# Patient Record
Sex: Male | Born: 1950 | Race: White | Hispanic: No | Marital: Married | Smoking: Never smoker
Health system: Southern US, Community
[De-identification: ages and names within clinical notes are randomized; demographics above are authoritative.]

## PROBLEM LIST (undated history)

## (undated) DIAGNOSIS — I2510 Atherosclerotic heart disease of native coronary artery without angina pectoris: Secondary | ICD-10-CM

## (undated) DIAGNOSIS — I480 Paroxysmal atrial fibrillation: Secondary | ICD-10-CM

## (undated) DIAGNOSIS — R001 Bradycardia, unspecified: Secondary | ICD-10-CM

## (undated) DIAGNOSIS — Z720 Tobacco use: Secondary | ICD-10-CM

## (undated) DIAGNOSIS — M797 Fibromyalgia: Secondary | ICD-10-CM

## (undated) DIAGNOSIS — E119 Type 2 diabetes mellitus without complications: Secondary | ICD-10-CM

## (undated) DIAGNOSIS — I10 Essential (primary) hypertension: Secondary | ICD-10-CM

## (undated) DIAGNOSIS — E785 Hyperlipidemia, unspecified: Secondary | ICD-10-CM

## (undated) HISTORY — PX: CARDIAC CATHETERIZATION: SHX172

## (undated) HISTORY — DX: Tobacco use: Z72.0

## (undated) HISTORY — DX: Essential (primary) hypertension: I10

## (undated) HISTORY — DX: Hyperlipidemia, unspecified: E78.5

## (undated) HISTORY — DX: Bradycardia, unspecified: R00.1

## (undated) HISTORY — DX: Atherosclerotic heart disease of native coronary artery without angina pectoris: I25.10

## (undated) HISTORY — DX: Paroxysmal atrial fibrillation: I48.0

## (undated) HISTORY — PX: SHOULDER SURGERY: SHX246

---

## 1997-10-19 ENCOUNTER — Encounter: Admission: RE | Admit: 1997-10-19 | Discharge: 1998-01-17 | Payer: Self-pay

## 2001-06-24 ENCOUNTER — Encounter: Payer: Self-pay | Admitting: Occupational Medicine

## 2001-06-24 ENCOUNTER — Encounter: Admission: RE | Admit: 2001-06-24 | Discharge: 2001-06-24 | Payer: Self-pay | Admitting: Occupational Medicine

## 2002-06-30 ENCOUNTER — Encounter: Payer: Self-pay | Admitting: Occupational Medicine

## 2002-06-30 ENCOUNTER — Encounter: Admission: RE | Admit: 2002-06-30 | Discharge: 2002-06-30 | Payer: Self-pay | Admitting: Occupational Medicine

## 2004-01-13 ENCOUNTER — Ambulatory Visit: Payer: Self-pay | Admitting: Internal Medicine

## 2004-02-06 ENCOUNTER — Ambulatory Visit: Payer: Self-pay | Admitting: Internal Medicine

## 2004-05-23 ENCOUNTER — Ambulatory Visit: Payer: Self-pay | Admitting: Internal Medicine

## 2005-02-08 ENCOUNTER — Ambulatory Visit: Payer: Self-pay | Admitting: Cardiology

## 2005-05-07 ENCOUNTER — Ambulatory Visit: Payer: Self-pay | Admitting: Gastroenterology

## 2006-01-06 ENCOUNTER — Ambulatory Visit: Payer: Self-pay | Admitting: Internal Medicine

## 2006-01-06 ENCOUNTER — Inpatient Hospital Stay (HOSPITAL_COMMUNITY): Admission: EM | Admit: 2006-01-06 | Discharge: 2006-01-08 | Payer: Self-pay | Admitting: Emergency Medicine

## 2006-01-17 ENCOUNTER — Ambulatory Visit: Payer: Self-pay | Admitting: Internal Medicine

## 2006-01-22 ENCOUNTER — Ambulatory Visit: Payer: Self-pay | Admitting: Internal Medicine

## 2006-02-03 ENCOUNTER — Ambulatory Visit: Payer: Self-pay | Admitting: Internal Medicine

## 2006-09-02 ENCOUNTER — Ambulatory Visit: Payer: Self-pay | Admitting: Internal Medicine

## 2006-09-09 ENCOUNTER — Ambulatory Visit: Payer: Self-pay | Admitting: Internal Medicine

## 2007-02-16 ENCOUNTER — Ambulatory Visit: Payer: Self-pay | Admitting: Internal Medicine

## 2007-11-20 ENCOUNTER — Ambulatory Visit (HOSPITAL_COMMUNITY): Admission: RE | Admit: 2007-11-20 | Discharge: 2007-11-20 | Payer: Self-pay | Admitting: Internal Medicine

## 2007-12-29 ENCOUNTER — Encounter: Admission: RE | Admit: 2007-12-29 | Discharge: 2007-12-29 | Payer: Self-pay | Admitting: Neurosurgery

## 2008-01-22 ENCOUNTER — Ambulatory Visit (HOSPITAL_COMMUNITY): Admission: RE | Admit: 2008-01-22 | Discharge: 2008-01-22 | Payer: Self-pay | Admitting: Neurosurgery

## 2008-01-25 ENCOUNTER — Ambulatory Visit (HOSPITAL_COMMUNITY): Admission: RE | Admit: 2008-01-25 | Discharge: 2008-01-25 | Payer: Self-pay | Admitting: Neurosurgery

## 2008-02-18 ENCOUNTER — Ambulatory Visit (HOSPITAL_COMMUNITY): Admission: RE | Admit: 2008-02-18 | Discharge: 2008-02-19 | Payer: Self-pay | Admitting: Neurosurgery

## 2008-11-11 ENCOUNTER — Ambulatory Visit: Payer: Self-pay | Admitting: Unknown Physician Specialty

## 2008-11-18 ENCOUNTER — Ambulatory Visit: Payer: Self-pay | Admitting: Unknown Physician Specialty

## 2008-11-25 ENCOUNTER — Ambulatory Visit: Payer: Self-pay | Admitting: Unknown Physician Specialty

## 2008-12-21 ENCOUNTER — Ambulatory Visit: Payer: Self-pay | Admitting: Gastroenterology

## 2009-03-05 ENCOUNTER — Emergency Department: Payer: Self-pay | Admitting: Unknown Physician Specialty

## 2010-02-19 ENCOUNTER — Ambulatory Visit: Payer: Self-pay | Admitting: Internal Medicine

## 2010-03-07 ENCOUNTER — Ambulatory Visit: Payer: Self-pay | Admitting: Orthopedic Surgery

## 2010-04-03 ENCOUNTER — Encounter: Payer: Self-pay | Admitting: Physician Assistant

## 2010-05-02 ENCOUNTER — Encounter: Payer: Self-pay | Admitting: Physician Assistant

## 2010-07-07 IMAGING — CR DG MYELOGRAM CERVICAL
2 series · 2 of 2 positions shown · IV contrast (omnipaque)
Comparison: MRI 12/29/2007
COMPARISON: MRI 12/29/2007

CLINICAL DATA: Neck pain with left arm pain.

]
MYELOGRAM CERVICAL
TECHNIQUE: Lumbar puncture and injection of Omnipaque contrast was
performed by Dr. Enjyogi. Following injection of intrathecal
Omnipaque contrast, spine imaging in multiple projections was
performed using fluoroscopy.
CLINICAL DATA: Neck pain and left arm pain.
CT MYELOGRAPHY CERVICAL SPINE
TECHNIQUE: CT imaging of the cervical spine was performed after
intrathecal contrast administration. Multiplanar CT image
reconstructions were also generated.

[view not recorded (1 of 2)]
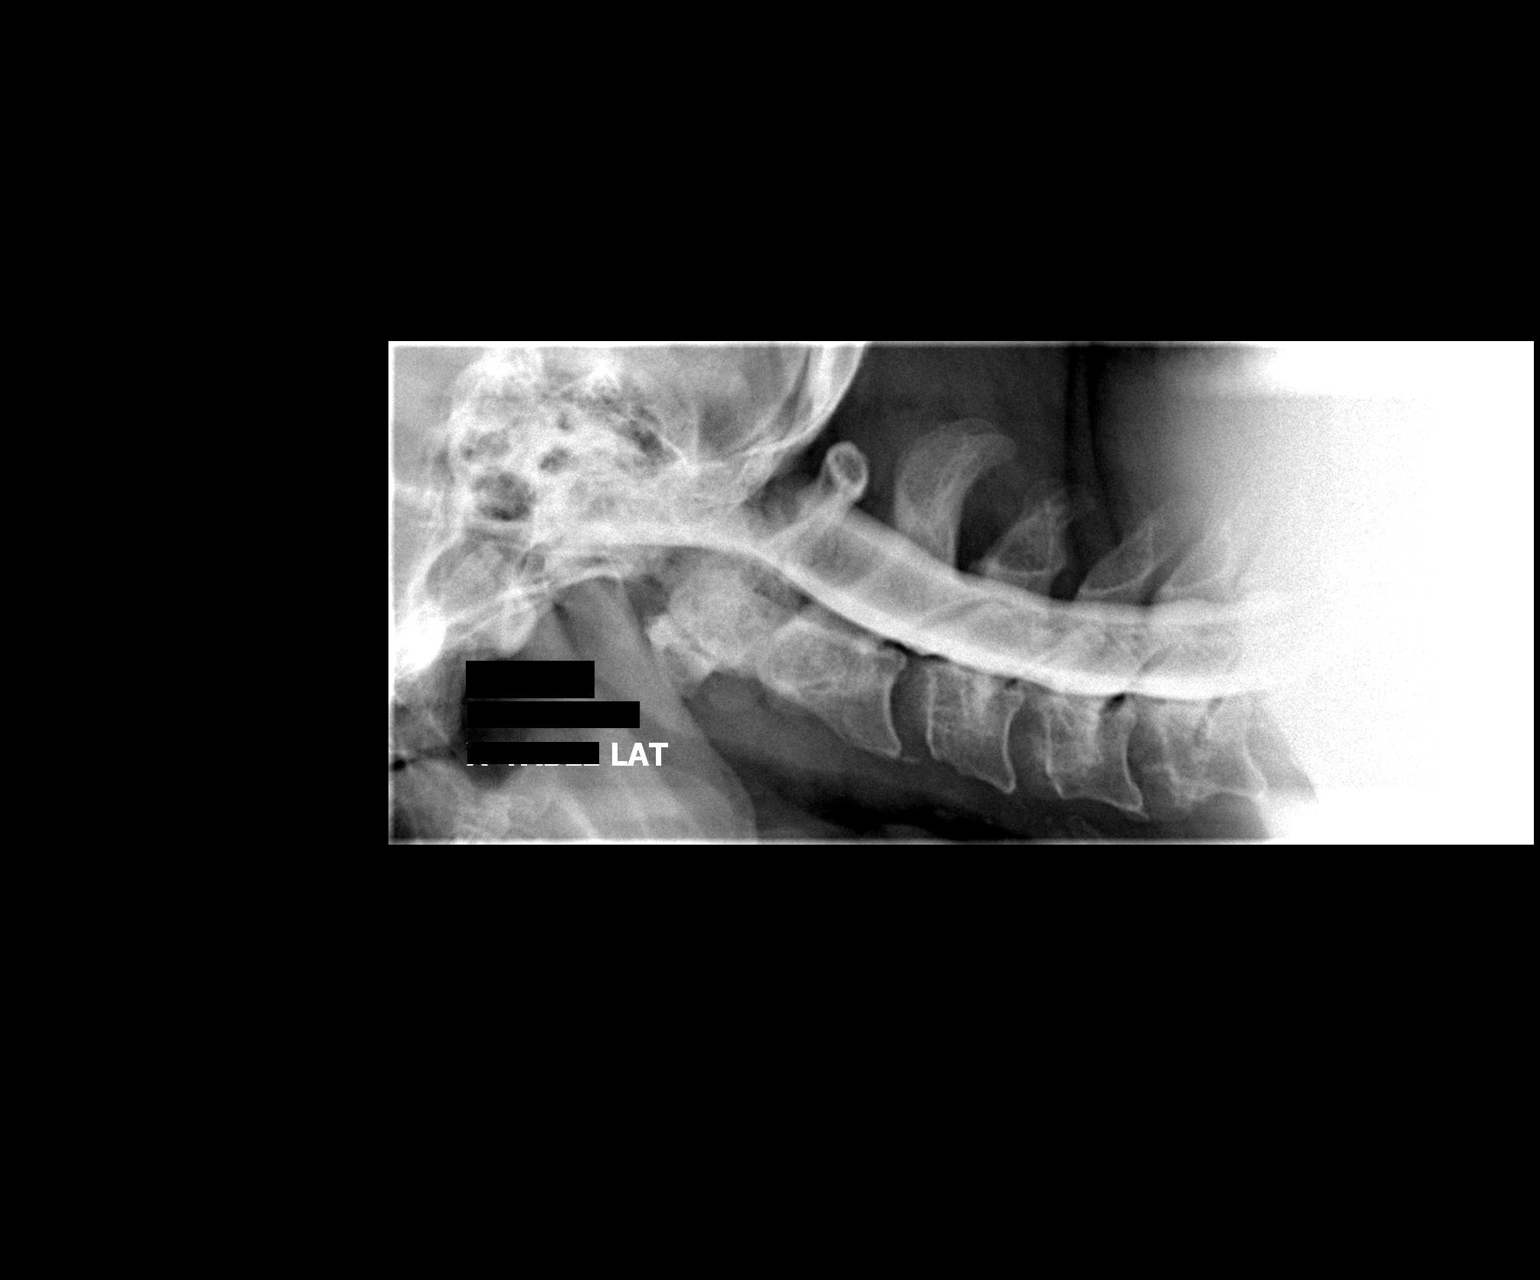

[view not recorded (2 of 2)]
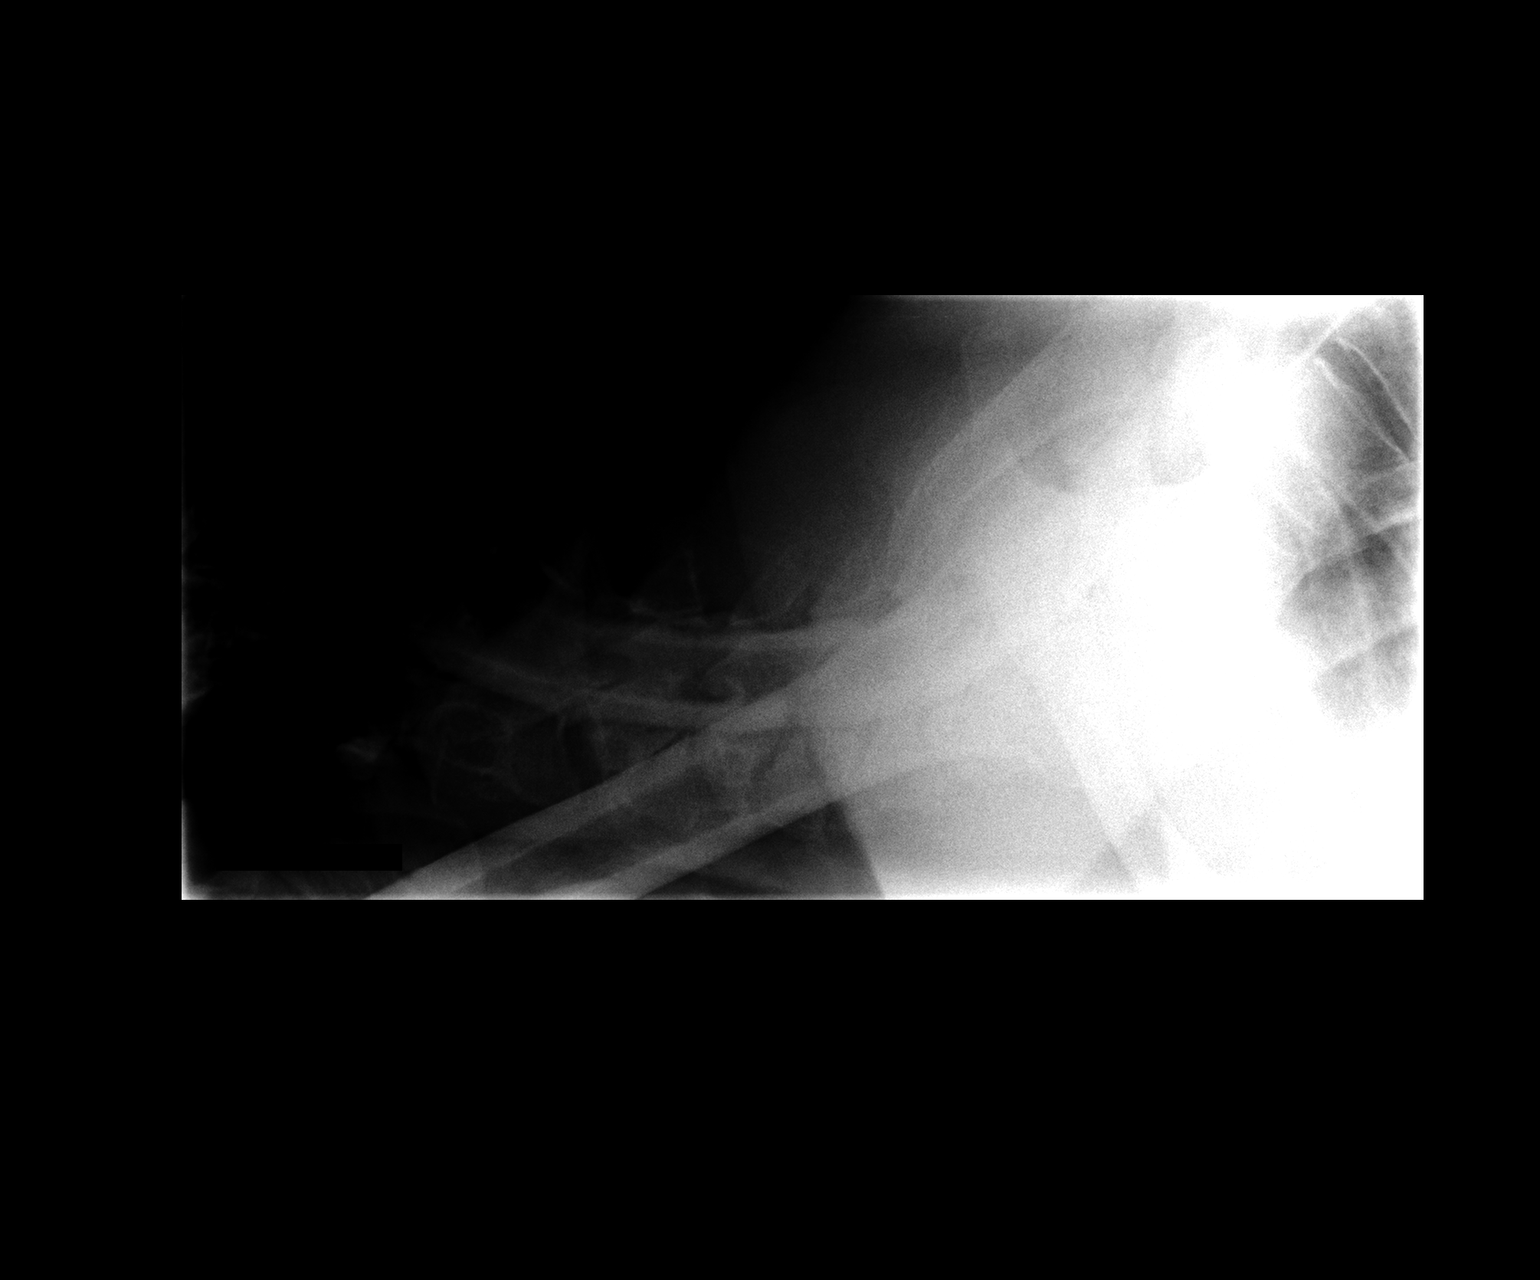

[2 of 2 positions shown; findings below may reference images not displayed]

FINDINGS: There is a prominent extradural defect on the left   at
C3-4.  The remaining nerve roots appear normal.  There is no
significant spinal stenosis.  The cord is normal in size.
IMPRESSION: Extradural defect on the left at C3-4.
FINDINGS: Normal cervical alignment.  There is no acute bony
abnormality.  The cord is normal in size and configuration.  There
was signal abnormality in  the cord at C3-4 on the MRI but this is
not visualized on CT.

C2-3:  Negative

C3-4:  Mild facet arthropathy bilaterally.  There is mild disc
degeneration.  There is very mild left foraminal narrowing due to
bony overgrowth.

C4-5:  There is advanced facet degeneration on the left with bony
overgrowth and cystic changes in the facet joint.  There is facet
joint effusion.  There is moderately severe foraminal encroachment
on the left due to bony overgrowth of the facet as well as mild
diffuse uncinate spurring.  There is mild facet degeneration on the
right with mild foraminal narrowing on the right.  The central
canal is sufficiently patent.

C5-6:  Mild facet degeneration bilaterally.  There is no
significant uncinate spurring or spinal stenosis.

C6-7:  Mild disc degeneration and without spinal stenosis

C7-T1:  Negative
IMPRESSION: There is moderately severe left foraminal encroachment at C4-5 due
to extensive facet overgrowth on the left.  There is also mild
uncinate spurring bilaterally.  There is mild right foraminal
encroachment.

Mild degenerative changes at other levels.  There is no significant
central canal stenosis.

## 2010-07-15 ENCOUNTER — Emergency Department (HOSPITAL_COMMUNITY)
Admission: EM | Admit: 2010-07-15 | Discharge: 2010-07-15 | Disposition: A | Discharge status: Home / Self Care | Payer: BC Managed Care – PPO | Attending: Emergency Medicine | Admitting: Emergency Medicine

## 2010-07-15 ENCOUNTER — Inpatient Hospital Stay (HOSPITAL_COMMUNITY)
Admission: AD | Admit: 2010-07-15 | Discharge: 2010-07-18 | DRG: 125 | Disposition: A | Discharge status: Home / Self Care | Payer: BC Managed Care – PPO | Source: Other Acute Inpatient Hospital | Attending: Cardiology | Admitting: Cardiology

## 2010-07-15 ENCOUNTER — Emergency Department (HOSPITAL_COMMUNITY): Payer: BC Managed Care – PPO

## 2010-07-15 DIAGNOSIS — I4891 Unspecified atrial fibrillation: Secondary | ICD-10-CM | POA: Insufficient documentation

## 2010-07-15 DIAGNOSIS — Z7982 Long term (current) use of aspirin: Secondary | ICD-10-CM | POA: Insufficient documentation

## 2010-07-15 DIAGNOSIS — Z79899 Other long term (current) drug therapy: Secondary | ICD-10-CM | POA: Insufficient documentation

## 2010-07-15 DIAGNOSIS — E785 Hyperlipidemia, unspecified: Secondary | ICD-10-CM | POA: Diagnosis present

## 2010-07-15 DIAGNOSIS — R079 Chest pain, unspecified: Secondary | ICD-10-CM | POA: Insufficient documentation

## 2010-07-15 DIAGNOSIS — I2510 Atherosclerotic heart disease of native coronary artery without angina pectoris: Secondary | ICD-10-CM | POA: Diagnosis present

## 2010-07-15 DIAGNOSIS — R0789 Other chest pain: Secondary | ICD-10-CM | POA: Diagnosis present

## 2010-07-15 DIAGNOSIS — I10 Essential (primary) hypertension: Secondary | ICD-10-CM | POA: Insufficient documentation

## 2010-07-15 DIAGNOSIS — E119 Type 2 diabetes mellitus without complications: Secondary | ICD-10-CM | POA: Insufficient documentation

## 2010-07-15 LAB — DIFFERENTIAL
Basophils Absolute: 0.1 10*3/uL (ref 0.0–0.1)
Eosinophils Relative: 3 % (ref 0–5)
Lymphocytes Relative: 27 % (ref 12–46)
Monocytes Absolute: 0.7 10*3/uL (ref 0.1–1.0)
Neutrophils Relative %: 62 % (ref 43–77)

## 2010-07-15 LAB — CBC
Hemoglobin: 14.5 g/dL (ref 13.0–17.0)
MCV: 89.5 fL (ref 78.0–100.0)
Platelets: 235 10*3/uL (ref 150–400)
RBC: 4.65 MIL/uL (ref 4.22–5.81)
WBC: 9.3 10*3/uL (ref 4.0–10.5)

## 2010-07-15 LAB — CARDIAC PANEL(CRET KIN+CKTOT+MB+TROPI)
CK, MB: 1 ng/mL (ref 0.3–4.0)
Total CK: 84 U/L (ref 7–232)
Troponin I: 0.02 ng/mL (ref 0.00–0.06)

## 2010-07-15 LAB — BASIC METABOLIC PANEL
BUN: 13 mg/dL (ref 6–23)
Chloride: 101 mEq/L (ref 96–112)
Creatinine, Ser: 1.04 mg/dL (ref 0.4–1.5)
Glucose, Bld: 146 mg/dL — ABNORMAL HIGH (ref 70–99)
Potassium: 4.2 mEq/L (ref 3.5–5.1)
Sodium: 135 mEq/L (ref 135–145)

## 2010-07-15 LAB — POCT CARDIAC MARKERS
CKMB, poc: <1 ng/mL — ABNORMAL LOW (ref 1.0–8.0)
Troponin i, poc: <0.05 ng/mL (ref 0.00–0.09)

## 2010-07-15 LAB — PROTIME-INR: INR: 1.01 (ref 0.00–1.49)

## 2010-07-16 DIAGNOSIS — I2510 Atherosclerotic heart disease of native coronary artery without angina pectoris: Secondary | ICD-10-CM

## 2010-07-16 DIAGNOSIS — I4891 Unspecified atrial fibrillation: Secondary | ICD-10-CM

## 2010-07-16 LAB — HEPARIN LEVEL (UNFRACTIONATED): Heparin Unfractionated: 0.1 IU/mL — ABNORMAL LOW (ref 0.30–0.70)

## 2010-07-16 LAB — GLUCOSE, CAPILLARY
Glucose-Capillary: 171 mg/dL — ABNORMAL HIGH (ref 70–99)
Glucose-Capillary: 101 mg/dL — ABNORMAL HIGH (ref 70–99)

## 2010-07-16 LAB — LIPID PANEL
HDL: 33 mg/dL — ABNORMAL LOW (ref >39)
LDL Cholesterol: 55 mg/dL (ref 0–99)
VLDL: 64 mg/dL — ABNORMAL HIGH (ref 0–40)

## 2010-07-16 LAB — CBC
HCT: 38.4 % — ABNORMAL LOW (ref 39.0–52.0)
MCH: 31.3 pg (ref 26.0–34.0)
RDW: 13.7 % (ref 11.5–15.5)
WBC: 6.6 10*3/uL (ref 4.0–10.5)

## 2010-07-16 LAB — T4, FREE: Free T4: 1.18 ng/dL (ref 0.80–1.80)

## 2010-07-16 LAB — CARDIAC PANEL(CRET KIN+CKTOT+MB+TROPI): Relative Index: INVALID (ref 0.0–2.5)

## 2010-07-16 LAB — POCT ACTIVATED CLOTTING TIME: Activated Clotting Time: 146 seconds

## 2010-07-17 DIAGNOSIS — R072 Precordial pain: Secondary | ICD-10-CM

## 2010-07-17 LAB — CBC
HCT: 35.3 % — ABNORMAL LOW (ref 39.0–52.0)
Hemoglobin: 12.5 g/dL — ABNORMAL LOW (ref 13.0–17.0)
MCHC: 35.4 g/dL (ref 30.0–36.0)
RBC: 4 MIL/uL — ABNORMAL LOW (ref 4.22–5.81)

## 2010-07-17 LAB — GLUCOSE, CAPILLARY
Glucose-Capillary: 164 mg/dL — ABNORMAL HIGH (ref 70–99)
Glucose-Capillary: 182 mg/dL — ABNORMAL HIGH (ref 70–99)

## 2010-07-18 LAB — CBC
HCT: 36.6 % — ABNORMAL LOW (ref 39.0–52.0)
Hemoglobin: 12.9 g/dL — ABNORMAL LOW (ref 13.0–17.0)
MCV: 86.9 fL (ref 78.0–100.0)
RBC: 4.21 MIL/uL — ABNORMAL LOW (ref 4.22–5.81)
WBC: 6.1 10*3/uL (ref 4.0–10.5)

## 2010-07-18 LAB — GLUCOSE, CAPILLARY: Glucose-Capillary: 160 mg/dL — ABNORMAL HIGH (ref 70–99)

## 2010-07-19 ENCOUNTER — Ambulatory Visit: Payer: Self-pay | Admitting: Orthopedic Surgery

## 2010-07-29 NOTE — Discharge Summary (Signed)
Gregory [name], Gregory [name] NO.:  [id number]  MEDICAL RECORD NO.:  [id number]           PATIENT TYPE:  I  LOCATION:  2005                         FACILITY:  [facility]  PHYSICIAN:  Peter M. Swaziland, M.D.  DATE OF BIRTH:  1950-09-19  DATE OF ADMISSION:  07/15/2010 DATE OF DISCHARGE:  07/18/2010                              DISCHARGE SUMMARY   PRIMARY CARDIOLOGIST:  Formally Dr. Sherryl Manges, going for followup in [place] was to be determined cardiologist/ nurse practitioner.  DISCHARGE DIAGNOSES: 1. Paroxysmal atrial fibrillation.     a.     Thromboembolic risk reduction with Pradaxa and      antiarrhythmic therapy with sotalol, both new medications. 2. Noncardiac chest pain.     a.     Cardiac catheterization, July 16, 2010:  Moderate mid LAD      in-stent restenosis (unchanged).  Mild nonobstructive stenosis of      the left circumflex and right coronary arteries and preserved left      ventricular function.  Initiated on statin therapy at this      admission. 3. Dyslipidemia (triglycerides 322, HDL 33, LDL 55, total     cholesterol/HDL ratio 4.6).     a.     Initiated on statin therapy at this admission (simvastatin      40 mg p.o. nightly). 4. Bradycardia.     a.     Beta-blocker discontinued in the setting of initiation on      sotalol.  SECONDARY DIAGNOSES: 1. Coronary artery disease.     a.     Percutaneous coronary intervention with bare-metal stent to      mid LAD, 2006 [facility]).     b.     Cardiac catheterization October 2007, revealing 50% in-stent      restenosis and one jailed septal perforator with 90% stenosis      (jailed septal similar to description from [facility] cath report).  No      significant disease in right coronary artery or circumflex.      Preserved LV function. 2. Non-insulin-dependent diabetes mellitus. 3. Hypertension. 4. Remote tobacco abuse, quit 25 years ago.  ALLERGIES AND INTOLERANCES:  CODEINE  (nausea/vomiting).  PROCEDURES: 1. EKG, July 15, 2010:  Atrial fibrillation with RVR at 180 bpm     without ischemic changes. 2. EKG, July 15, 2010:  Normal sinus rhythm, 80 bpm, no significant     ST changes. 3. Chest x-ray, July 15, 2010:  No active disease. 4. Cardiac catheterization, July 16, 2010:  Please see discharge     diagnoses #2 subsection A. 5. 2-D echocardiogram, July 17, 2010:  LV cavity size normal, wall     thickness normal, LVEF normal at 55%-60% with normal wall motion,     but grade 2 diastolic dysfunction. 6. EKG, July 18, 2010:  Sinus bradycardia, 50 bpm, no significant ST     changes, borderline for left axis deviation, no significant Q-     waves, PR 176, QRS 92, and QTC of 435.  HISTORY OF PRESENT ILLNESS:  Gregory [name] is a 60 year old gentleman with the above-noted complex  medical history, who presented initially to [facility] emergency department with complaints of chest discomfort. Subsequently, diagnosed with paroxysmal atrial fibrillation with RVR.  The patient reports 2-3 weeks of nearly daily episodes of chest discomfort.  On the date of his presentation, he had an episode lasting approximately 45 minutes that was relieved with nitroglycerin.  He presented to [facility] ED and was started on heparin, eventually being transferred to [facility] for further evaluation.  When seen by the Cardiology fellow, he was asymptomatic.  The patient reported poor exercise tolerance, but not significantly changed except for perhaps more short of breath in usual.  No PND, orthopnea, pre-syncope.  The patient does note that his chest discomfort was associated with shortness of breath and palpitations.  He has chronic shoulder discomfort which is unchanged.  He denies any other changes.  HOSPITAL COURSE:  The patient was admitted, and after initially spontaneously converting in the emergency department to normal sinus rhythm, he had further episodes of  atrial fibrillation and was initiated on sotalol therapy.  He was noted to have bradycardia and therefore his preadmission beta-blockade therapy was discontinued.  Due to his pretest probability and concerning symptoms, he did undergo cardiac catheterization in spite of enzymes being negative on the afternoon of July 16, 2010.  Luckily, there was no significant change from his prior study in 2007.  He was kept overnight and had a 2-D echocardiogram completed on the following day.  Given a CHADS-VASC score of 3 with diabetes, hypertension, and known coronary disease, the patient was initiated on Pradaxa for thromboembolic risk reduction.  He tolerated his sotalol therapy well and was seen on the morning of July 18, 2010 by Dr. Peter Swaziland and deemed stable for discharge.  One caveat would be the patient still needs to ambulate, off his nitro patch without symptoms prior to going home.  However, the patient is currently feeling fine.  He is anticipated to leave this afternoon.  There is no intolerance listed to statin therapy and the patient has dyslipidemia as well as coronary disease and therefore has been initiated on simvastatin 40 mg p.o. nightly, but this is a new medication at this admission per records available to me.  Also, new this admission as above are sotalol and Pradaxa.  The patient lives in [place] and prefers to follow up in [place] and our office will call him with a follow-up appointment in approximately 2 weeks.  At the time of discharge, the patient received his new medication list, prescriptions, follow-up instructions, and post-cath instructions.  All questions and concerns were addressed prior to leaving the hospital.  DISCHARGE LABS:  WBC is 6.1, HGB 12.9, HCT 36.6, PLT count is 192, WBC differential on admission was within normal limits.  Pro-time 13.5, INR 1.01.  Glucose ranged from 146-307 at this admission.  Sodium 135, potassium 4.2, chloride 101,  bicarb 24, BUN 14, creatinine 1.04. Initial set of point-of-care markers were negative and two full sets of enzymes were also negative.  Total cholesterol 152, triglycerides 322, HDL 33, LDL 55, VLDL 64, total cholesterol/HDL ratio 4.6, free T4 1.18, and TSH of 1.406.  FOLLOWUP PLANS AND APPOINTMENT:  Please see hospital course.  DISCHARGE MEDICATIONS: 1. Acetaminophen 325 mg 1-2 tablets p.o. q.4 h p.r.n. 2. Pradaxa 150 mg 1 tablet p.o. q.12 h. 3. Simvastatin 40 mg 1 tablet p.o. nightly. 4. Sotalol 80 mg 1 tablet p.o. b.i.d. 5. Enteric-coated aspirin 81 mg p.o. daily. 6. Citalopram 20 mg 1 tablet p.o.  daily. 7. Glipizide XL 5 mg 1 tablet t.i.d. 8. Lisinopril 10 mg 1 tablet p.o. daily. 9. Metformin 1 gram p.o. b.i.d. (to be resumed on July 19, 2010). 10.Sublingual nitroglycerin 0.4 mg q.5 minutes up to three doses     p.r.n. for chest discomfort.  DURATION OF DISCHARGE ENCOUNTER:  Including physician time was 35 minutes.     Jarrett Ables, PAC   ______________________________ Peter M. Swaziland, M.D.    MS/MEDQ  D:  07/18/2010  T:  07/18/2010  Job:  [id number]  cc:   Bettey Mare. Lyman Bishop, NP Gerrit Friends. Dietrich Pates, MD, [facility] Salvia, MD, [facility]  Electronically Signed by Jarrett Ables PAC on 07/27/2010 02:05:03 PMElectronically Signed by PETER Swaziland M.D. on 07/29/2010 12:39:30 PM

## 2010-08-01 ENCOUNTER — Encounter: Payer: Self-pay | Admitting: Adult Health

## 2010-08-01 ENCOUNTER — Ambulatory Visit (INDEPENDENT_AMBULATORY_CARE_PROVIDER_SITE_OTHER): Payer: BC Managed Care – PPO | Admitting: Adult Health

## 2010-08-01 DIAGNOSIS — Z7901 Long term (current) use of anticoagulants: Secondary | ICD-10-CM

## 2010-08-01 DIAGNOSIS — E7800 Pure hypercholesterolemia, unspecified: Secondary | ICD-10-CM

## 2010-08-01 DIAGNOSIS — I2510 Atherosclerotic heart disease of native coronary artery without angina pectoris: Secondary | ICD-10-CM

## 2010-08-01 DIAGNOSIS — I4891 Unspecified atrial fibrillation: Secondary | ICD-10-CM

## 2010-08-01 DIAGNOSIS — I480 Paroxysmal atrial fibrillation: Secondary | ICD-10-CM | POA: Insufficient documentation

## 2010-08-01 DIAGNOSIS — E785 Hyperlipidemia, unspecified: Secondary | ICD-10-CM

## 2010-08-01 MED ORDER — PANTOPRAZOLE SODIUM 20 MG PO TBEC: 20.0000 mg | DELAYED_RELEASE_TABLET | Freq: Every day | ORAL | Status: AC

## 2010-08-01 NOTE — Assessment & Plan Note (Signed)
Repeat lipids and LFT's in 6 weeks.

## 2010-08-01 NOTE — Progress Notes (Signed)
HPI: Gregory [name] is a 60 y/o CM we are seeing on follow-up after recent admission to [facility] for PAF and chest pain.  He is usually followed by Dr. Swaziland in [place], but wishes to be seen in [place] [facility] as it is closer to his home. He has a history of CAD with PCI using BMS to LAD, dyslipidemia, diabetes and hypertension. On recent admission he had cardiac catheterization completed secondary to complaints of chest pain during Afib with known history of CAD.  Catheterization demonstrated moderate mid LAD stenosis, mild nonobstructive stenosis of left Cx and RCA with preserved LV fx.  He was placed on sotolol and pradaxa on discharge and was released at [facility].  He was also placed on statin therapy with total cholesterol of 152, HDL 33,LDL 55.  He is so far tolerating the medications without complaints.  He occasionally has indigestion, but does not take a PPI on a regular basis.  He has noticed indigestion more intense since starting new medications.   Allergies  Allergen Reactions  . Codeine     Makes patient sick     Current Outpatient Prescriptions  Medication Sig Dispense Refill  . acetaminophen (TYLENOL) 325 MG tablet Take 650 mg by mouth every 6 (six) hours as needed.        Marland Kitchen aspirin 81 MG tablet Take 81 mg by mouth daily.        . dabigatran (PRADAXA) 150 MG CAPS Take 150 mg by mouth every 12 (twelve) hours.        Marland Kitchen glipiZIDE (GLUCOTROL) 5 MG tablet Take 5 mg by mouth 3 (three) times daily.        Marland Kitchen lisinopril (PRINIVIL,ZESTRIL) 10 MG tablet Take 10 mg by mouth daily.        . metFORMIN (GLUCOPHAGE) 1000 MG tablet Take 1,000 mg by mouth 2 (two) times daily with a meal.        . nitroGLYCERIN (NITROSTAT) 0.4 MG SL tablet Place 0.4 mg under the tongue every 5 (five) minutes as needed.        . simvastatin (ZOCOR) 40 MG tablet Take 40 mg by mouth at bedtime.        . sotalol (BETAPACE) 80 MG tablet Take 80 mg by mouth 2 (two) times daily.        Marland Kitchen DISCONTD: citalopram (CELEXA) 20 MG  tablet Take 20 mg by mouth daily.        Marland Kitchen DISCONTD: metFORMIN (GLUCOPHAGE) 500 MG tablet Take 1,000 mg by mouth 2 (two) times daily with a meal.         Past Medical History  Diagnosis Date  . Paroxysmal atrial fibrillation   . Chest pain   . Dyslipidemia   . Bradycardia   . CAD (coronary artery disease)   . Hypertension   . Tobacco abuse     Past Surgical History  Procedure Date  . Cardiac catheterization   . Shoulder surgery     ROS: > PHYSICAL EXAM BP 121/77  Pulse 66  Ht 5\' 8"  (1.727 m)  Wt 195 lb (88.451 kg)  BMI 29.65 kg/m2  SpO2 95%  EKG:  ASSESSMENT AND PLAN

## 2010-08-01 NOTE — Patient Instructions (Signed)
**Note De-Identified Momodou Consiglio Obfuscation** Your physician recommends that you return for lab work in: 6 weeks  Your physician has recommended you make the following change in your medication: start taking Protonix 20mg  daily  Your physician recommends that you complete the Hemoccult cards that were given to you at today's visit (Please follow directions in envelope with hemoccult cards)  Your physician recommends that you schedule a follow-up appointment in: 3 months (ALSO, YOUR PHYSICIAN RECOMMENDS THAT YOU FOLLOW UP WITH YOUR GI PHYSICIAN)

## 2010-08-01 NOTE — Assessment & Plan Note (Addendum)
Currently in [facility] without complaint of rapid heart rate or palpitations. He is tolerating sotalol without issue.  He was unable to tolerate beta blockers during hospitalization secondary to bradycardia. Review of EKG today has HR of 63 bpm.  He will continue this without changes.  He is also tolerating pradaxa.  He does have a history of hemmoroids with occasional bleed with bowel movement. He has not had colonoscopy in 1 1/2 yrs.  I have recommended that he see his GI physician to check in and have colonoscopy sooner and to inform him of medication changes.

## 2010-08-02 NOTE — Cardiovascular Report (Signed)
NAMEDEUNDRA, BARD              ACCOUNT NO.:  [id number]  MEDICAL RECORD NO.:  [id number]           PATIENT TYPE:  I  LOCATION:  2005                         FACILITY:  [facility]  PHYSICIAN:  Veverly Fells. Excell Seltzer, MD  DATE OF BIRTH:  October 20, 1950  DATE OF PROCEDURE:  07/16/2010 DATE OF DISCHARGE:                           CARDIAC CATHETERIZATION   PROCEDURES: 1. Left heart catheterization. 2. Selective coronary angiography. 3. Left ventricular angiography.  PROCEDURAL INDICATION:  Mr. Michelin is a 60 year old gentleman who presented with chest pain.  He has been diagnosed with paroxysmal atrial fib with RVR.  He ruled out for myocardial infarction, but has known CAD and with recurrent chest pain was referred for cardiac cath.  The patient was also started on sotalol for atrial fib and anticoagulation has been considered.  Risks and indications of the procedure were reviewed with the patient. Informed consent was obtained.  The right groin was prepped, draped, and anesthetized with 1% lidocaine using modified Seldinger technique.  A 5- French sheath was placed in the right femoral artery.  Standard Judkins catheters were used for coronary angiography and left ventriculography. Catheter exchanges were performed over guidewire.  There were no immediate complications.  Next, an Exoseal device was used for femoral hemostasis.  PROCEDURAL FINDINGS:  Aortic pressure 120/68 with a mean of 90, left ventricular pressure 119/19.  Left ventriculography shows normal LV function.  The estimated left ventricular ejection fraction is 60% to 65%.  CORONARY ANGIOGRAPHY:  The right coronary artery is of moderate caliber. There is nonobstructive mid-right coronary stenosis of 40% to 50%.  The vessel is dominant and supplies a PDA branch without significant stenosis.  There is also a moderate-sized acute marginal branch.  Left main coronary artery.  The left main is widely patent.  There is minor  irregularity to of the distal left main and there is mild calcification present.  The left main divides into the LAD and left circumflex.  LAD.  The proximal LAD is of large caliber.  Just after the first diagonal branch, the LAD tapers with a 50% stenosis present.  There is a patent stent in the mid-LAD involving the origin of 2 septal perforator branches.  The stent has 40% to 50% in-stent restenosis.  The remaining portions of the mid and distal LAD have no significant obstructive disease.  Left circumflex.  The left circumflex has 40% to 50% ostial stenosis present.  The circumflex gives off 3 marginal branches, all are widely patent.  There are also two posterolateral branches present.  FINAL ASSESSMENT: 1. Moderate mid LAD in-stent restenosis. 2. Mild nonobstructive stenosis of left circumflex and right coronary     arteries. 3. Preserved left ventricular function.  Overall, the patient has stable coronary anatomy.  His catheterization films were compared to his previous study from 2007.  Recommend continued medical therapy.  The patient will be transferred back to a telemetry bed since he has been started on sotalol.     Veverly Fells. Excell Seltzer, MD     MDC/MEDQ  D:  07/16/2010  T:  07/17/2010  Job:  [id number]  cc:   Demetria Pore.  Swaziland, M.D.  Electronically Signed by Tonny Bollman MD on 08/02/2010 10:38:36 AM

## 2010-08-10 ENCOUNTER — Other Ambulatory Visit: Payer: Self-pay | Admitting: *Deleted

## 2010-08-14 NOTE — Op Note (Signed)
NAMEMICK, TANGUMA              ACCOUNT NO.:  [id number]   MEDICAL RECORD NO.:  [id number]          PATIENT TYPE:  OIB   LOCATION:  3534                         FACILITY:  [facility]   PHYSICIAN:  Hewitt Shorts, M.D.DATE OF BIRTH:  [birth year]   DATE OF PROCEDURE:  02/18/2008  DATE OF DISCHARGE:                               OPERATIVE REPORT   PREOPERATIVE DIAGNOSIS:  Multilevel cervical spondylosis with  significant facet arthropathy with particular left C4-5 facet  arthropathy with neuroforaminal stenosis and resulting left cervical  radiculopathy.   POSTOPERATIVE DIAGNOSIS:  Multilevel cervical spondylosis with  significant facet arthropathy with particular left C4-5 facet  arthropathy with neuroforaminal stenosis and resulting left cervical  radiculopathy.   PROCEDURE:  Left C4-5 posterior cervical laminotomy and foraminotomy  with microdissection and C4-5 posterior cervical arthrodesis with Oasis  posterior instrumentation with C-arm fluoroscopic guidance, Actifuse and  Infuse.   SURGEON:  Hewitt Shorts, M.D.   ASSISTANTSWebb Silversmith, NP and Channing Mutters, M.D.   ANESTHESIA:  General endotracheal.   INDICATIONS:  The patient is a 60 year old man who presented with left  cervical radiculopathy.  The patient has primarily spondylosis with  significant multilevel facet arthropathy with the worse facet  arthropathy causing neuroforaminal stenosis on the left at C4-5.  Decision was made to proceed with posterior decompression and  arthrodesis.   PROCEDURE:  The patient was brought to the operating room and placed  under general endotracheal anesthesia.  The patient was placed on 3-pin  Mayfield head holder and turned to a prone position.  The back of the  head was shaved and then the occiput and neck and upper back were  prepped with Betadine soap and solution, and draped in sterile fashion.  The midline was infiltrated with local anesthetic with epinephrine and a  midline  incision was made over the C4-5 level and carried down through  the subcutaneous tissue.  Bipolar electrocautery was used to maintain  hemostasis.  Dissection was carried down to the posterior cervical  fascia and spinous processes.  The fascia was incised bilaterally.  The  paracervical musculature was dissected from the spinous process and  lamina in a subperiosteal fashion.  The C4-5 intralaminar space was  identified.  An x-ray was taken to confirm the localization.  Then, C-  arm fluoroscope was draped and brought into the field to provide  guidance for drooling the holes for lateral mass screws.  The lateral  masses were identified bilaterally and entry points posteriorly were  identified bilaterally at C4 and C5.  Each screw hole was started with  an awl and continued with a 2.5-mm drill.  Once each hole was drilled,  they were tapped.  The screws were placed later after decompression was  performed.   The microscope was then draped and brought into the field to provide  navigation, illumination, and visualization, and decompression was  performed using microdissection and microsurgical technique.  A left C4-  5 laminotomy, foraminotomy, and partial facetectomy was performed using  the X-Max drill with both conventional burs and diamond burs, which were  used as we got  close to the soft tissues.  The ligamentum flavum was  carefully removed and we identified the thecal sac and exiting nerve  roots.  There were significant synovial overgrowth and spondylitic  overgrowth and this was carefully removed decompressing the neural  foramen.  Once neuroforamen was decompressed, hemostasis was established  with use of Gelfoam soaked in thrombin, we were able to remove the  Gelfoam and the wound was irrigated and good hemostasis confirmed.   We then went ahead and placed the lateral mass screws bilaterally using  3.5 mm x 12 mm screws.  We selected 25 mm rods, they were placed within  the  screw heads and secured with locking caps which were tightened  subsequently against the counter torque.  Prior to placing the rods and  locking caps, we decorticated the lamina spinous processes C4 and C5 as  well as the facet joints.  The synovium was scraped out of the joints,  and then we packed a small pledget of Infuse within the joints and laid  additional Infuse over the bony surfaces.  Then, we packed a 5 mL volume  of Actifuse over the lamina and spinous processes and lateral masses and  into the facet joints.  Care was taken none of the materials including  the Infuse and Actifuse were within the laminotomy, foraminotomy, or  neural foramen.   Once the bone graft was placed, we proceeded with closure.  The deep  fascia was closed with interrupted undyed 0 Vicryl sutures.  The  subcutaneous and subcuticular were closed with interrupted inverted 2-0  and 3-0 undyed Vicryl sutures.  The skin was reapproximated with  surgical stables.  The wound was dressed with Adaptic and sterile gauze.  The procedure was tolerated well.  The estimated blood loss was 175 mL.  Sponge and needle count were correct.  Following surgery, the patient  was turned back to the supine position, and a 3-pin Mayfield head holder  was removed.  The patient was placed on Aspen cervical collar to be  reversed from the anesthetic, extubated, and transferred to the recovery  room for further care.      Hewitt Shorts, M.D.  Electronically Signed     RWN/MEDQ  D:  02/18/2008  T:  02/19/2008  Job:  [id number]

## 2010-08-16 ENCOUNTER — Other Ambulatory Visit: Payer: Self-pay

## 2010-08-16 MED ORDER — SIMVASTATIN 40 MG PO TABS: 40.0000 mg | ORAL_TABLET | Freq: Every day | ORAL | Status: AC

## 2010-08-16 MED ORDER — SOTALOL HCL 80 MG PO TABS: 80.0000 mg | ORAL_TABLET | Freq: Two times a day (BID) | ORAL | Status: DC

## 2010-08-16 MED ORDER — DABIGATRAN ETEXILATE MESYLATE 150 MG PO CAPS: 150.0000 mg | ORAL_CAPSULE | Freq: Two times a day (BID) | ORAL | Status: DC

## 2010-08-17 NOTE — H&P (Signed)
[name], Gregory [name] NO.:  [id number]   MEDICAL RECORD NO.:  [id number]          PATIENT TYPE:  INP   LOCATION:  6531                         FACILITY:  [facility]   PHYSICIAN:  Duke Salvia, MD, FACCDATE OF BIRTH:  06/12/1950   DATE OF ADMISSION:  01/06/2006  DATE OF DISCHARGE:                                HISTORY & PHYSICAL   BRIEF HISTORY:  Gregory [name] is a 60 year old white male who was transported  via EMS to [facility] Emergency Room secondary to syncope.  Gregory [name]  states that approximately 12 p.m. today after working he walked from his  machine into an office to prepare to have lunch.  Prior to beginning to sit  down in a chair to eat lunch, he stated that he felt fine.  He has not had  any problems with palpitations, chest discomfort, nausea, vomiting or  shortness of breath.  He states that as he was sitting down in a chair he  felt everything go black, like a light switch turned off.  The next thing he  remembers is that he was sitting in a chair shaking his head back and  fourth, and the coworkers asked, Are you okay and did he need help.  Mr.  [name] stated that he told his coworker that he was fine.  However, he  states that over the next 30 minutes or so he began to notice bilateral hand  and arm numbness which extended to his chin and lips.  He also became very  nauseated and very weak.  He told his coworker of this, and the boss called  EMS for transportation.  EMS report is not available.   Gregory [name] states that his symptoms all in all lasted approximately an  hour and half.  However, at the time of our interview, he was still having  some lower leg numbness.  He feels that his symptoms did not resemble the  symptoms that he had with his stress test and stenting last year.  He states  that his loss of consciousness was probably just for a second or two.  He  denies prior episodes of syncope.   PAST MEDICAL HISTORY:  ALLERGIES INCLUDE  CODEINE.   The patient does not know his medications.  He states that he is on  something for his sugar, 2 blood pressure medications, a cholesterol pill  and sublingual nitroglycerin.  He states that he also takes an aspirin 81 mg  usually every day, although he has not had any for over a week.   His medical history is notable for hypertension and type 2 diabetes.  However, he does not check his sugar or blood pressures at home.  He has a  history of hyperlipidemia, and according to the patient, it was checked  approximately three months ago and was okay.  He has a history of known  coronary artery disease.  Initial cath in 2001 showed nonobstructive  coronary artery disease [facility] from [facility].  He had a positive  Myoview in November 2006 which resulted in a cardiac catheterization in  [place] and referral to [facility] in November 2006 at which time he received a  Vision stent to his mid LAD.  Echocardiogram according to [facility] records  showed an EF of 45% on January 03, 2005.  His medical history is also notable  for rectal bleeding with a colonoscopy approximately 4 months ago and polyp  removal and hemorrhoids.  He states that he has an appointment with [name]  tomorrow because of a 4-week history of bright red blood in his stools.   SOCIAL HISTORY:  He resides in [place] with his wife.  He has 3  children, 8 grandchildren and no great-grandchildren.  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  He has not smoked in 25 years.  Prior history is  notable for a 3-1/2 pack a day for 15 years.  He denies any alcohol, drugs,  herbal medications, specific diet.  He states that he does occasionally walk  but cannot tell the duration or frequency.   FAMILY HISTORY:  His mother died at the age of 87 with a myocardial  infarction, history of diabetes, and status post amputation.  His father is  alive at the age of 60 with a history of pacer and diabetes.  He has 4  brothers living,  1 with COPD, 2 with diabetes.  He has 1 brother deceased  secondary to a gunshot wound and history of myocardial infarction.  He has  no sisters.   REVIEW OF SYSTEMS:  In addition to above is notable for occasional headache,  glasses for which he states he needs a new prescription, hearing loss that  he has not had evaluated, dentures, chronic dyspnea on exertion -  however  it depends on the temperature, occasional wheezing, positive snoring,  possible sleep apnea based on his wife's history with daytime sleepiness,  nocturia, arthralgias in his hands and knees, diarrhea, bright red blood per  rectum for the last 4 weeks and also melena, some GERD symptoms, abdominal  discomfort in his left upper quadrant - according to his wife has been for  several weeks.   PHYSICAL EXAMINATION:  GENERAL:  Well-nourished, well-developed, obese white  male in no apparent distress.  Blood pressure is 117/77, pulse is 70 and  regular, respirations 18, temperature 97.8.  HEENT:  Is unremarkable except for dentures.  NECK: Supple without thyromegaly, adenopathy, JVD, or carotid bruits.  CHEST:  Symmetrical excursion.  LUNG:  Sounds were diminished but clear to auscultation.  HEART:  PMI is not displaced, regular rate and rhythm.  I do not appreciate  any rubs, clicks or gallops.  SKIN:  Integument is intact.  ABDOMEN:  Is obese without organomegaly, masses or tenderness.  EXTREMITIES:  No cyanosis, clubbing or edema.  All peripheral pulses are  symmetrical and intact without abdominal or femoral bruits.  MUSCULOSKELETAL:  Is unremarkable.  NEUROLOGICAL:  Grossly unremarkable.   Chest x-ray shows no active disease.  Head CT was negative.  EKG shows  normal sinus rhythm, low voltage, nonspecific ST-T wave changes.  No old  EKGs for comparison.  H&H is 12.6 and 37.0.  Sodium 135, potassium 2.7, BUN 12, creatinine 1.0, glucose 156.  Point of care marker in the emergency room  was negative x1.  H&H on  January 30, 2005 in the past was 14.5 and 43.0   IMPRESSION:  1. Syncope temporarily related to change in position but very abrupt      onset/offset with residual nausea.  2. History of coronary artery  disease with prior percutaneous coronary      intervention plus/minus myocardial infarction with an ejection fraction      of 45% by echocardiogram and attenuation on Myoview; however, specific      results are not available.  3. History of diabetes, hypertension and reoccurring rectal bleeding.   DISPOSITION:  Dr. Graciela Husbands reviewed the patient's history, spoke with and  examined the patient and agrees with the above.  Dr. Graciela Husbands feels that his  mechanism of syncope history suggestive orthostatic with getting up from  chair and sitting with spontaneous resolution.  However, the abrupt nature  of the onset/offset is concerning for arrhythmia.  We will admit to telemetry to rule out myocardial infarction.  We will  obtain serial hemoglobins.  Dr. Graciela Husbands his discussed with the patient to  proceed with cardiac catheterization in the morning and consider EP study on  Wednesday in the morning.  The patient was advised of no driving.  Dr. Graciela Husbands  will determine the term.     ______________________________  Joellyn Rued, PA-C    ______________________________  Duke Salvia, MD, [facility]    EW/MEDQ  D:  01/06/2006  T:  01/08/2006  Job:  [id number]   cc:   Heath Gold, M.D.

## 2010-08-17 NOTE — Discharge Summary (Signed)
NAMECARSTON, [name]              ACCOUNT NO.:  [id number]   MEDICAL RECORD NO.:  [id number]          PATIENT TYPE:  INP   LOCATION:  6531                         FACILITY:  [facility]   PHYSICIAN:  Duke Salvia, MD, FACCDATE OF BIRTH:  09-16-1950   DATE OF ADMISSION:  01/06/2006  DATE OF DISCHARGE:  01/08/2006                                 DISCHARGE SUMMARY   ALLERGIES:  This patient has allergies to CODEINE.   PRINCIPAL DIAGNOSES:  1. Admitted with syncope.  2. Tilt-table January 08, 2006 suspected, POTS with tachycardia and      increasing in upright position over time.  3. Left heart catheterization, October 9. The study showed ejection      fraction 65%. The LAD has a stent at the mid point and has a 50% in-      stent restenosis. There is also a stable stenosis of the septal      perforator which is jailed by the stent but the patient is not having      acute coronary syndrome and this is not likely to have been cause of      his syncope.   SECONDARY DIAGNOSES:  1. Hypertension.  2. Type 2 diabetes.  3. Dyslipidemia.  4. Coronary artery disease with stenting at [facility]      November 2006.  5. History of gastrointestinal bleeding recurrent secondary to polyps.   PROCEDURE:  1. January 07, 2006 left heart catheterization. Study showed that the right      coronary is free of disease.  The left circumflex and the obtuse      marginals one, two, and three are free of significant disease.  The LAD      has a stent in place, 50% in-stent restenosis. Second septal perforator      his 90% ostial stenosis which is a stable finding. His ejection      fraction is 55%.  2. Tilt table study January 08, 2006. The patient's blood pressure did not      decrease with upright position.  However, the patient's heart rate from      the supine position of 62 after 5 minutes was 105 beats per minute,      suspicious for POTS.   BRIEF HISTORY:  Gregory [name] is a  60 year old male transported to  [facility] after a syncopal event at his work. The patient was at  work and it was noon.  He had just sat down to eat lunch.  He remembers  going to sit down, but actually not sitting down. His vision blacked out and  suddenly the lights switched off.  The next thing he remembers is  shakiness had having his coworkers asking if he was okay. Loss of  consciousness if any was very brief. He remembers that the hands, arm, chin,  and lips were all known and had some nausea post awakening. He will be  admitted with workup to include catheterization as well as checking for  orthostatic hypotension.   HOSPITAL COURSE:  The patient presents to [facility] October 8  after syncopal episode. The abrupt nature of the onset and offset was  concerning for arrhythmia. The patient will have serial hemoglobins,  telemetry study, and will be catheterized on October 9. Study has been  performed and shows no significant change in the coronary anatomy, ejection  fraction 65%. He tolerated the procedure well.  The patient has had no  arrhythmias on telemetry maintaining sinus rhythm. He then underwent tilt  table on October 10 which suggested postural tachycardia consistent with  POTS. The patient will discharge after the study October 10. He is asked to  not drive for the next 2 days, this is post cath precaution, and to not lift  anything heavier than 10 pounds for next 2 weeks.  He is able to drive  however, after the 2-day proscription.  He has an office visit with Dr. Corliss Parish on Thursday October 11.  This is to be arranged and Dr. Graciela Husbands will be  discussing findings of the tilt table with Dr. Dellis Filbert on telephone this  afternoon October 10.   LABORATORY STUDIES:  Pertinent to this admission. Hemoglobin was 13.9,  hematocrit 39.2, white cells 6.9 platelets 209.  Serum electrolytes; sodium  139, potassium 4.6, chloride 105, carbonate 27, BUN is 14, creatinine  1.10,  glucose is 131. His PT is 14.8, INR 1.1.  The alkaline phosphatase 46, SGOT  28, SGPT is 28.  The troponin I studies were in serial fashion every 8 hours  x3; less than 0.01 then 0.02 then less than 0.01.     ______________________________  Maple Mirza, PA    ______________________________  Duke Salvia, MD, [facility]    GM/MEDQ  D:  01/08/2006  T:  01/10/2006  Job:  [id number]   cc:   Duke Salvia, MD, [facility]  Corliss Parish

## 2010-08-17 NOTE — Cardiovascular Report (Signed)
NAMEGLENDA, Gregory [name]              ACCOUNT NO.:  [id number]   MEDICAL RECORD NO.:  [id number]          PATIENT TYPE:  INP   LOCATION:  6529                         FACILITY:  [facility]   PHYSICIAN:  Salvadore Farber, MD  DATE OF BIRTH:  04-20-1950   DATE OF PROCEDURE:  01/07/2006  DATE OF DISCHARGE:                              CARDIAC CATHETERIZATION   PROCEDURE:  Left heart catheterization, left ventriculography, coronary  angiography.   INDICATIONS:  Mr. Langille is a 59 year old gentleman status post bare metal  stent placement in the midportion of the LAD at [facility] in November of 2006.  He has generally done well since then.  He presented with an episode of  syncope, yesterday.  He has ruled out for myocardial infarction by serial  enzymes.  He was referred by Dr. Graciela Husbands for cardiac catheterization to  exclude progressive coronary disease or in-stent restenosis as a contributor  to his syncope.   PROCEDURAL TECHNIQUE:  Informed consent was obtained.  Under 1% lidocaine  local anesthesia, a 5-French sheath was placed in the right common femoral  artery using the modified Seldinger technique.  Diagnostic angiography and  ventriculography were performed using JL-4, JR-4, and pigtail catheters.  The patient tolerated the procedure well; and was transferred to the holding  room in stable condition.  Sheaths will be removed there.   COMPLICATIONS:  None.   FINDINGS:  1. LV:  1201/10/09.  EF of 65% without regional wall motion abnormality.  2. No aortic stenosis or mitral regurgitation.  3. Left main:  Angiographically normal.  4. LAD:  Moderate-sized vessel giving rise to one large branching diagonal      and two large septal perforators.  There is a previously placed stent      in the mid-LAD.  There is approximately 50% InStent restenosis in the      distal portion of the stent.  One of the jailed septal's  has a 90%      stenosis that is similar to that described on the  catheterization      report from [place].  5. Circumflex:  A fairly large vessel giving rise to 3 obtuse marginals.      These are angiographically normal.  6. RCA:  Small though dominant vessel.  It is angiographically normal.   IMPRESSION/PLAN:  The patient has normal left ventricular size and systolic  function without regional wall motion abnormality.  He has moderate InStent  restenosis within the LAD and a stable stenosis of the jailed septal  perforator.  In the absence of an acute coronary syndrome, these are not  likely to be causal for his syncope.  Further evaluation ongoing.      Salvadore Farber, MD  Electronically Signed    WED/MEDQ  D:  01/07/2006  T:  01/08/2006  Job:  [id number]   cc:   Corliss Parish

## 2010-10-06 NOTE — H&P (Signed)
Gregory [name], Gregory [name]              ACCOUNT NO.:  [id number]  MEDICAL RECORD NO.:  [id number]           PATIENT TYPE:  I  LOCATION:  2005                         FACILITY:  [facility]  PHYSICIAN:  Natasha Bence, MD       DATE OF BIRTH:  1950/09/25  DATE OF ADMISSION:  07/15/2010 DATE OF DISCHARGE:                             HISTORY & PHYSICAL   CHIEF COMPLAINT:  Chest discomfort.  HISTORY OF PRESENT ILLNESS:  [name] is a 60 year old white male with known coronary artery disease who presented to the [facility] Emergency Room today with worsening chest discomfort.  He had PCI to his mid LAD in 2006 at [facility].  He had a bare-metal stent placed in 2007.  He had repeat catheterization per chest discomfort, which showed 50% in- stent restenosis of the stent.  He had been doing relatively well until approximately 2-3 weeks ago, when he started having 2-3 episodes daily what he called gripping, chest discomfort, it was located substernally and nonradiating, it was accompanied by shortness of breath and palpitations.  He denies any lightheadedness or syncope.  He says his pain comes on at anytime at rest or with exertion, they are unpredictable.  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  This episode lasted for about 45 minutes and was relieved with nitroglycerin. Eventually, he was started on heparin infusion at the [facility] Emergency Room, and then transferred here for further evaluation.  He is currently chest pain free.  He has not had any recent PND, orthopnea, reports poor exercise tolerance and gets short of breath fairly easily. Otherwise, he reports doing well except for chronic shoulder discomfort. He denies any recent bleeding episodes except for some mild hemorrhoidal bleeding.  He does not have any upcoming surgeries planned.  REVIEW OF SYSTEMS:  Otherwise, negative except for what was  stated above.  PAST MEDICAL HISTORY: 1. Coronary artery disease, status post PCI in 2006 with bare-metal     stent to his LAD or his mid LAD. 2. Dyslipidemia. 3. Diabetes. 4. Hypertension.  SURGICAL HISTORY:  He has had shoulder surgery.  FAMILY HISTORY:  Mother had myocardial infarction at age of 61.  He has brothers with diabetes.  SOCIAL HISTORY:  He was an ex-smoker, but does not smoke in 25 years. He smoked pretty heavily for about 15 years, denied alcohol or drug use.  ALLERGIES:  He is allergic to CODEINE.  HOME MEDICATIONS: 1. Aspirin 81 mg p.o. daily. 2. Lisinopril 10 mg p.o. daily. 3. Toprol-XL 25 mg p.o. daily. 4. Glipizide ER 5 mg p.o. b.i.d. 5. Metformin 1000 mg p.o. b.i.d.  PHYSICAL EXAMINATION:  VITAL SIGNS:  Blood pressure 112/78, heart rate is 76, respiratory rate of 18, O2 sats are 98% on room air, temperature is 98 degrees. GENERAL:  He is well-developed white male in no apparent distress. EYES:  He has anicteric sclerae. NECK:  Normal jugular venous pressure.  No carotid bruits. LUNGS:  Clear to auscultation bilaterally. CARDIOVASCULAR:  On my exam, he was irregular with rate in the 80s. No  murmurs, rubs, or gallops.  He had symmetrical pulses throughout. ABDOMEN:  Soft, nontender.  No masses.  Nondistended. EXTREMITIES:  Warm without edema. NEUROLOGIC:  Afocal.  Cranial nerves are intact. PSYCHIATRIC:  Within normal limits.  He is oriented to person, place, and time.  LABS:  Sodium is 135, potassium is 4.2, chloride is 101, bicarbonate is 24, BUN is 13, creatinine is 1.04, glucose is 146, calcium is 9.3, troponin was less than 0.05.  Hematocrit was 42, white count was 9.3, platelet count was 238, PT was 13.5, PTT was 34, INR was 1.01.  His chest x-ray was normal.  He had 2 EKGs at [facility], the first one showed AFib with the rate of 180 beats per minute.  He had no ischemic ST changes.  His second EKG shows sinus mechanism with the rate of  80 beats per minute with no ST changes.  IMPRESSION AND PLAN:  This is a 60 year old diabetic with known coronary artery disease.  He had previous bare-metal stent placed to his mid left anterior descending artery in 2006 and repeat catheterization here in 2007.  He had a 50% in-stent restenosis, which has been treated medically. He reports chest pain, what sounds anginal in nature and sounds like that he has been having more frequently and also at rest (unstable angina).  We will continue his heparin infusion that was started at [facility].  He already received aspirin.  He is already on a statin.  We will continue to his home dose of 80 mg of pravastatin daily.  We will also continue his lisinopril 10 mg daily.  He also has a new diagnosis of atrial fibrillation which is paroxysmal at this point likely exacerbated by ischemia.  He is on heparin infusion now.  We will continue metoprolol, but we will increase this to 50 mg twice daily.  He will hold his glipizide and his metformin as he will be n.p.o. and was probably getting catheterization in the morning.  We will place him on sliding scale insulin for diabetes control.  Otherwise, we will keep him n.p.o. for likely cath in the morning.  We will monitor on telemetry with serial cardiac enzymes.  At this point, his cardiac enzymes had been normal, was given his history I feel examination of his coronaries would be pertinent at this point in time.          ______________________________ Natasha Bence, MD     MH/MEDQ  D:  07/15/2010  T:  07/16/2010  Job:  [id number]  Electronically Signed by Natasha Bence MD on 10/06/2010 07:20:16 PM

## 2010-11-05 ENCOUNTER — Ambulatory Visit: Payer: BC Managed Care – PPO | Admitting: Cardiology

## 2010-12-31 LAB — BASIC METABOLIC PANEL
BUN: 11
CO2: 25
Calcium: 8.7
Chloride: 100
Creatinine, Ser: 1.07
GFR calc Af Amer: >60
GFR calc non Af Amer: >60
Glucose, Bld: 206 — ABNORMAL HIGH
Potassium: 4.5
Sodium: 133 — ABNORMAL LOW

## 2010-12-31 LAB — CREATININE, SERUM
Creatinine, Ser: 1.01
GFR calc Af Amer: >60
GFR calc non Af Amer: >60

## 2010-12-31 LAB — GLUCOSE, CAPILLARY
Glucose-Capillary: 205 — ABNORMAL HIGH
Glucose-Capillary: 210 — ABNORMAL HIGH

## 2010-12-31 LAB — BUN: BUN: 12

## 2011-01-01 LAB — GLUCOSE, CAPILLARY
Glucose-Capillary: 163 — ABNORMAL HIGH
Glucose-Capillary: 169 — ABNORMAL HIGH
Glucose-Capillary: 180 — ABNORMAL HIGH
Glucose-Capillary: 208 — ABNORMAL HIGH
Glucose-Capillary: 224 — ABNORMAL HIGH

## 2011-01-01 LAB — CBC
HCT: 39.7
Hemoglobin: 13.8
MCHC: 34.7
MCV: 90.3
Platelets: 194
RBC: 4.39
RDW: 13.5
WBC: 7.1

## 2011-01-01 LAB — BASIC METABOLIC PANEL
BUN: 17
CO2: 24
Calcium: 9.4
Chloride: 102
Creatinine, Ser: 1.22
GFR calc Af Amer: >60
GFR calc non Af Amer: >60
Glucose, Bld: 185 — ABNORMAL HIGH
Potassium: 4.2
Sodium: 135

## 2011-01-23 ENCOUNTER — Other Ambulatory Visit: Payer: Self-pay | Admitting: *Deleted

## 2011-01-23 MED ORDER — DABIGATRAN ETEXILATE MESYLATE 150 MG PO CAPS: 150.0000 mg | ORAL_CAPSULE | Freq: Two times a day (BID) | ORAL | Status: AC

## 2011-04-16 ENCOUNTER — Other Ambulatory Visit: Payer: Self-pay | Admitting: *Deleted

## 2011-04-16 MED ORDER — SOTALOL HCL 80 MG PO TABS: 80.0000 mg | ORAL_TABLET | Freq: Two times a day (BID) | ORAL | Status: DC

## 2011-09-02 ENCOUNTER — Other Ambulatory Visit: Payer: Self-pay | Admitting: *Deleted

## 2011-09-02 MED ORDER — SOTALOL HCL 80 MG PO TABS: 80.0000 mg | ORAL_TABLET | Freq: Two times a day (BID) | ORAL | Status: AC

## 2011-09-03 ENCOUNTER — Encounter: Payer: Self-pay | Admitting: Cardiology

## 2011-12-05 ENCOUNTER — Encounter: Payer: Self-pay | Admitting: Cardiology

## 2012-12-15 ENCOUNTER — Emergency Department (HOSPITAL_COMMUNITY): Payer: No Typology Code available for payment source

## 2012-12-15 ENCOUNTER — Encounter (HOSPITAL_COMMUNITY): Payer: Self-pay

## 2012-12-15 ENCOUNTER — Emergency Department (HOSPITAL_COMMUNITY)
Admission: EM | Admit: 2012-12-15 | Discharge: 2012-12-15 | Disposition: A | Discharge status: Home / Self Care | Payer: No Typology Code available for payment source | Attending: Emergency Medicine | Admitting: Emergency Medicine

## 2012-12-15 DIAGNOSIS — E119 Type 2 diabetes mellitus without complications: Secondary | ICD-10-CM | POA: Insufficient documentation

## 2012-12-15 DIAGNOSIS — Z9889 Other specified postprocedural states: Secondary | ICD-10-CM | POA: Insufficient documentation

## 2012-12-15 DIAGNOSIS — Y9389 Activity, other specified: Secondary | ICD-10-CM | POA: Insufficient documentation

## 2012-12-15 DIAGNOSIS — S233XXA Sprain of ligaments of thoracic spine, initial encounter: Secondary | ICD-10-CM

## 2012-12-15 DIAGNOSIS — S139XXA Sprain of joints and ligaments of unspecified parts of neck, initial encounter: Secondary | ICD-10-CM | POA: Insufficient documentation

## 2012-12-15 DIAGNOSIS — I10 Essential (primary) hypertension: Secondary | ICD-10-CM | POA: Insufficient documentation

## 2012-12-15 DIAGNOSIS — E785 Hyperlipidemia, unspecified: Secondary | ICD-10-CM | POA: Insufficient documentation

## 2012-12-15 DIAGNOSIS — S239XXA Sprain of unspecified parts of thorax, initial encounter: Secondary | ICD-10-CM | POA: Insufficient documentation

## 2012-12-15 DIAGNOSIS — I2510 Atherosclerotic heart disease of native coronary artery without angina pectoris: Secondary | ICD-10-CM | POA: Insufficient documentation

## 2012-12-15 DIAGNOSIS — Z79899 Other long term (current) drug therapy: Secondary | ICD-10-CM | POA: Insufficient documentation

## 2012-12-15 DIAGNOSIS — Y92410 Unspecified street and highway as the place of occurrence of the external cause: Secondary | ICD-10-CM | POA: Insufficient documentation

## 2012-12-15 DIAGNOSIS — S161XXA Strain of muscle, fascia and tendon at neck level, initial encounter: Secondary | ICD-10-CM

## 2012-12-15 DIAGNOSIS — Z7982 Long term (current) use of aspirin: Secondary | ICD-10-CM | POA: Insufficient documentation

## 2012-12-15 HISTORY — DX: Type 2 diabetes mellitus without complications: E11.9

## 2012-12-15 MED ORDER — HYDROCODONE-ACETAMINOPHEN 5-325 MG PO TABS
1.0000 | ORAL_TABLET | ORAL | Status: DC | PRN
Start: 2012-12-15 — End: 2016-12-30
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
## 2012-12-18 NOTE — ED Provider Notes (Signed)
Medical screening examination/treatment/procedure(s) were performed by non-physician practitioner and as supervising physician I was immediately available for consultation/collaboration.   Shatonya Passon W. Viviann Broyles, MD 12/18/12 0842 

## 2014-11-10 ENCOUNTER — Emergency Department
Admission: EM | Admit: 2014-11-10 | Discharge: 2014-11-10 | Disposition: A | Discharge status: Home / Self Care | Payer: Medicare Other | Attending: Emergency Medicine | Admitting: Emergency Medicine

## 2014-11-10 ENCOUNTER — Emergency Department: Payer: Medicare Other

## 2014-11-10 ENCOUNTER — Encounter: Payer: Self-pay | Admitting: *Deleted

## 2014-11-10 DIAGNOSIS — Y92410 Unspecified street and highway as the place of occurrence of the external cause: Secondary | ICD-10-CM | POA: Diagnosis not present

## 2014-11-10 DIAGNOSIS — E119 Type 2 diabetes mellitus without complications: Secondary | ICD-10-CM | POA: Insufficient documentation

## 2014-11-10 DIAGNOSIS — S161XXA Strain of muscle, fascia and tendon at neck level, initial encounter: Secondary | ICD-10-CM | POA: Diagnosis not present

## 2014-11-10 DIAGNOSIS — Z7982 Long term (current) use of aspirin: Secondary | ICD-10-CM | POA: Diagnosis not present

## 2014-11-10 DIAGNOSIS — I10 Essential (primary) hypertension: Secondary | ICD-10-CM | POA: Diagnosis not present

## 2014-11-10 DIAGNOSIS — Y998 Other external cause status: Secondary | ICD-10-CM | POA: Insufficient documentation

## 2014-11-10 DIAGNOSIS — S3992XA Unspecified injury of lower back, initial encounter: Secondary | ICD-10-CM | POA: Diagnosis present

## 2014-11-10 DIAGNOSIS — Z7901 Long term (current) use of anticoagulants: Secondary | ICD-10-CM | POA: Insufficient documentation

## 2014-11-10 DIAGNOSIS — Z79899 Other long term (current) drug therapy: Secondary | ICD-10-CM | POA: Diagnosis not present

## 2014-11-10 DIAGNOSIS — Y9389 Activity, other specified: Secondary | ICD-10-CM | POA: Diagnosis not present

## 2014-11-10 DIAGNOSIS — S0990XA Unspecified injury of head, initial encounter: Secondary | ICD-10-CM | POA: Insufficient documentation

## 2014-11-10 DIAGNOSIS — S39012A Strain of muscle, fascia and tendon of lower back, initial encounter: Secondary | ICD-10-CM | POA: Diagnosis not present

## 2014-11-10 DIAGNOSIS — M545 Low back pain: Secondary | ICD-10-CM

## 2014-11-10 HISTORY — DX: Fibromyalgia: M79.7

## 2014-11-10 MED ORDER — OXYCODONE-ACETAMINOPHEN 5-325 MG PO TABS
1.0000 | ORAL_TABLET | Freq: Once | ORAL | Status: AC
  Administered 2014-11-10: 1 via ORAL
  Filled 2014-11-10: qty 1

## 2014-11-10 MED ORDER — TRAMADOL HCL 50 MG PO TABS: 50.0000 mg | ORAL_TABLET | Freq: Four times a day (QID) | ORAL | Status: AC | PRN

## 2014-11-10 MED ORDER — ONDANSETRON 4 MG PO TBDP
4.0000 mg | ORAL_TABLET | Freq: Once | ORAL | Status: AC
  Administered 2014-11-10: 4 mg via ORAL
  Filled 2014-11-10: qty 1
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
## 2014-11-10 NOTE — Discharge Instructions (Signed)
Cervical Sprain °A cervical sprain is an injury in the neck in which the strong, fibrous tissues (ligaments) that connect your neck bones stretch or tear. Cervical sprains can range from mild to severe. Severe cervical sprains can cause the neck vertebrae to be unstable. This can lead to damage of the spinal cord and can result in serious nervous system problems. The amount of time it takes for a cervical sprain to get better depends on the cause and extent of the injury. Most cervical sprains heal in 1 to 3 weeks. °CAUSES  °Severe cervical sprains may be caused by:  °· Contact sport injuries (such as from football, rugby, wrestling, hockey, auto racing, gymnastics, diving, martial arts, or boxing).   °· Motor vehicle collisions.   °· Whiplash injuries. This is an injury from a sudden forward and backward whipping movement of the head and neck.  °· Falls.   °Mild cervical sprains may be caused by:  °· Being in an awkward position, such as while cradling a telephone between your ear and shoulder.   °· Sitting in a chair that does not offer proper support.   °· Working at a poorly designed computer station.   °· Looking up or down for long periods of time.   °SYMPTOMS  °· Pain, soreness, stiffness, or a burning sensation in the front, back, or sides of the neck. This discomfort may develop immediately after the injury or slowly, 24 hours or more after the injury.   °· Pain or tenderness directly in the middle of the back of the neck.   °· Shoulder or upper back pain.   °· Limited ability to move the neck.   °· Headache.   °· Dizziness.   °· Weakness, numbness, or tingling in the hands or arms.   °· Muscle spasms.   °· Difficulty swallowing or chewing.   °· Tenderness and swelling of the neck.   °DIAGNOSIS  °Most of the time your health care provider can diagnose a cervical sprain by taking your history and doing a physical exam. Your health care provider will ask about previous neck injuries and any known neck  problems, such as arthritis in the neck. X-rays may be taken to find out if there are any other problems, such as with the bones of the neck. Other tests, such as a CT scan or MRI, may also be needed.  °TREATMENT  °Treatment depends on the severity of the cervical sprain. Mild sprains can be treated with rest, keeping the neck in place (immobilization), and pain medicines. Severe cervical sprains are immediately immobilized. Further treatment is done to help with pain, muscle spasms, and other symptoms and may include: °· Medicines, such as pain relievers, numbing medicines, or muscle relaxants.   °· Physical therapy. This may involve stretching exercises, strengthening exercises, and posture training. Exercises and improved posture can help stabilize the neck, strengthen muscles, and help stop symptoms from returning.   °HOME CARE INSTRUCTIONS  °· Put ice on the injured area.   °¨ Put ice in a plastic bag.   °¨ Place a towel between your skin and the bag.   °¨ Leave the ice on for 15-20 minutes, 3-4 times a day.   °· If your injury was severe, you may have been given a cervical collar to wear. A cervical collar is a two-piece collar designed to keep your neck from moving while it heals. °¨ Do not remove the collar unless instructed by your health care provider. °¨ If you have long hair, keep it outside of the collar. °¨ Ask your health care provider before making any adjustments to your collar. Minor   adjustments may be required over time to improve comfort and reduce pressure on your chin or on the back of your head. °¨ If you are allowed to remove the collar for cleaning or bathing, follow your health care provider's instructions on how to do so safely. °¨ Keep your collar clean by wiping it with mild soap and water and drying it completely. If the collar you have been given includes removable pads, remove them every 1-2 days and hand wash them with soap and water. Allow them to air dry. They should be completely  dry before you wear them in the collar. °¨ If you are allowed to remove the collar for cleaning and bathing, wash and dry the skin of your neck. Check your skin for irritation or sores. If you see any, tell your health care provider. °¨ Do not drive while wearing the collar.   °· Only take over-the-counter or prescription medicines for pain, discomfort, or fever as directed by your health care provider.   °· Keep all follow-up appointments as directed by your health care provider.   °· Keep all physical therapy appointments as directed by your health care provider.   °· Make any needed adjustments to your workstation to promote good posture.   °· Avoid positions and activities that make your symptoms worse.   °· Warm up and stretch before being active to help prevent problems.   °SEEK MEDICAL CARE IF:  °· Your pain is not controlled with medicine.   °· You are unable to decrease your pain medicine over time as planned.   °· Your activity level is not improving as expected.   °SEEK IMMEDIATE MEDICAL CARE IF:  °· You develop any bleeding. °· You develop stomach upset. °· You have signs of an allergic reaction to your medicine.   °· Your symptoms get worse.   °· You develop new, unexplained symptoms.   °· You have numbness, tingling, weakness, or paralysis in any part of your body.   °MAKE SURE YOU:  °· Understand these instructions. °· Will watch your condition. °· Will get help right away if you are not doing well or get worse. °Document Released: 01/13/2007 Document Revised: 03/23/2013 Document Reviewed: 09/23/2012 °[employer]® Patient Information ©2015 [employer]. This information is not intended to replace advice given to you by your health care provider. Make sure you discuss any questions you have with your health care provider. °Motor Vehicle Collision °After a car crash (motor vehicle collision), it is normal to have bruises and sore muscles. The first 24 hours usually feel the worst. After that, you will  likely start to feel better each day. °HOME CARE °· Put ice on the injured area. °¨ Put ice in a plastic bag. °¨ Place a towel between your skin and the bag. °¨ Leave the ice on for 15-20 minutes, 03-04 times a day. °· Drink enough fluids to keep your pee (urine) clear or pale yellow. °· Do not drink alcohol. °· Take a warm shower or bath 1 or 2 times a day. This helps your sore muscles. °· Return to activities as told by your doctor. Be careful when lifting. Lifting can make neck or back pain worse. °· Only take medicine as told by your doctor. Do not use aspirin. °GET HELP RIGHT AWAY IF:  °· Your arms or legs tingle, feel weak, or lose feeling (numbness). °· You have headaches that do not get better with medicine. °· You have neck pain, especially in the middle of the back of your neck. °· You cannot control when you pee (urinate) or   poop (bowel movement). °· Pain is getting worse in any part of your body. °· You are short of breath, dizzy, or pass out (faint). °· You have chest pain. °· You feel sick to your stomach (nauseous), throw up (vomit), or sweat. °· You have belly (abdominal) pain that gets worse. °· There is blood in your pee, poop, or throw up. °· You have pain in your shoulder (shoulder strap areas). °· Your problems are getting worse. °MAKE SURE YOU:  °· Understand these instructions. °· Will watch your condition. °· Will get help right away if you are not doing well or get worse. °Document Released: 09/04/2007 Document Revised: 06/10/2011 Document Reviewed: 08/15/2010 °Exit[employer]® Patient Information ©2015 [employer]. This information is not intended to replace advice given to you by your health care provider. Make sure you discuss any questions you have with your health care provider. ° °
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
## 2016-12-30 ENCOUNTER — Other Ambulatory Visit: Payer: Self-pay

## 2016-12-30 ENCOUNTER — Emergency Department (HOSPITAL_COMMUNITY): Payer: Medicare HMO

## 2016-12-30 ENCOUNTER — Encounter (HOSPITAL_COMMUNITY): Payer: Self-pay | Admitting: Emergency Medicine

## 2016-12-30 ENCOUNTER — Emergency Department (HOSPITAL_COMMUNITY)
Admission: EM | Admit: 2016-12-30 | Discharge: 2016-12-30 | Disposition: A | Discharge status: Home / Self Care | Payer: Medicare HMO | Attending: Emergency Medicine | Admitting: Emergency Medicine

## 2016-12-30 DIAGNOSIS — Y998 Other external cause status: Secondary | ICD-10-CM | POA: Insufficient documentation

## 2016-12-30 DIAGNOSIS — E119 Type 2 diabetes mellitus without complications: Secondary | ICD-10-CM | POA: Diagnosis not present

## 2016-12-30 DIAGNOSIS — I480 Paroxysmal atrial fibrillation: Secondary | ICD-10-CM | POA: Insufficient documentation

## 2016-12-30 DIAGNOSIS — R51 Headache: Secondary | ICD-10-CM | POA: Insufficient documentation

## 2016-12-30 DIAGNOSIS — I10 Essential (primary) hypertension: Secondary | ICD-10-CM | POA: Insufficient documentation

## 2016-12-30 DIAGNOSIS — Z7982 Long term (current) use of aspirin: Secondary | ICD-10-CM | POA: Diagnosis not present

## 2016-12-30 DIAGNOSIS — Y939 Activity, unspecified: Secondary | ICD-10-CM | POA: Diagnosis not present

## 2016-12-30 DIAGNOSIS — R0789 Other chest pain: Secondary | ICD-10-CM | POA: Insufficient documentation

## 2016-12-30 DIAGNOSIS — E785 Hyperlipidemia, unspecified: Secondary | ICD-10-CM | POA: Insufficient documentation

## 2016-12-30 DIAGNOSIS — T07XXXA Unspecified multiple injuries, initial encounter: Secondary | ICD-10-CM

## 2016-12-30 DIAGNOSIS — Y92410 Unspecified street and highway as the place of occurrence of the external cause: Secondary | ICD-10-CM | POA: Insufficient documentation

## 2016-12-30 DIAGNOSIS — I2510 Atherosclerotic heart disease of native coronary artery without angina pectoris: Secondary | ICD-10-CM | POA: Insufficient documentation

## 2016-12-30 DIAGNOSIS — Z79899 Other long term (current) drug therapy: Secondary | ICD-10-CM | POA: Diagnosis not present

## 2016-12-30 DIAGNOSIS — T148XXA Other injury of unspecified body region, initial encounter: Secondary | ICD-10-CM | POA: Diagnosis not present

## 2016-12-30 LAB — BASIC METABOLIC PANEL
ANION GAP: 11 (ref 5–15)
BUN: 16 mg/dL (ref 6–20)
CALCIUM: 8.5 mg/dL — AB (ref 8.9–10.3)
CO2: 23 mmol/L (ref 22–32)
Chloride: 99 mmol/L — ABNORMAL LOW (ref 101–111)
Creatinine, Ser: 1.33 mg/dL — ABNORMAL HIGH (ref 0.61–1.24)
GFR, EST NON AFRICAN AMERICAN: 55 mL/min — AB (ref >60)
GLUCOSE: 322 mg/dL — AB (ref 65–99)
POTASSIUM: 4.3 mmol/L (ref 3.5–5.1)
Sodium: 133 mmol/L — ABNORMAL LOW (ref 135–145)

## 2016-12-30 LAB — CBC WITH DIFFERENTIAL/PLATELET
BASOS ABS: 0 10*3/uL (ref 0.0–0.1)
BASOS PCT: 0 %
Eosinophils Absolute: 0.2 10*3/uL (ref 0.0–0.7)
Eosinophils Relative: 3 %
HEMATOCRIT: 37 % — AB (ref 39.0–52.0)
Hemoglobin: 13 g/dL (ref 13.0–17.0)
LYMPHS PCT: 23 %
Lymphs Abs: 1.6 10*3/uL (ref 0.7–4.0)
MCH: 31.5 pg (ref 26.0–34.0)
MCHC: 35.1 g/dL (ref 30.0–36.0)
MCV: 89.6 fL (ref 78.0–100.0)
MONO ABS: 0.5 10*3/uL (ref 0.1–1.0)
Monocytes Relative: 8 %
NEUTROS ABS: 4.4 10*3/uL (ref 1.7–7.7)
Neutrophils Relative %: 66 %
PLATELETS: 208 10*3/uL (ref 150–400)
RBC: 4.13 MIL/uL — AB (ref 4.22–5.81)
RDW: 13.4 % (ref 11.5–15.5)
WBC: 6.7 10*3/uL (ref 4.0–10.5)

## 2016-12-30 MED ORDER — METHOCARBAMOL 500 MG PO TABS: 500.0000 mg | ORAL_TABLET | Freq: Three times a day (TID) | ORAL | 0 refills | Status: AC | PRN

## 2016-12-30 MED ORDER — MORPHINE SULFATE (PF) 4 MG/ML IV SOLN
4.0000 mg | INTRAVENOUS | Status: DC | PRN
  Administered 2016-12-30: 4 mg via INTRAVENOUS
  Filled 2016-12-30: qty 1

## 2016-12-30 MED ORDER — HYDROCODONE-ACETAMINOPHEN 5-325 MG PO TABS: 1.0000 | ORAL_TABLET | ORAL | 0 refills | Status: AC | PRN

## 2016-12-30 MED ORDER — ONDANSETRON HCL 4 MG/2ML IJ SOLN
4.0000 mg | Freq: Once | INTRAMUSCULAR | Status: AC
  Administered 2016-12-30: 4 mg via INTRAVENOUS
  Filled 2016-12-30: qty 2

## 2016-12-30 NOTE — Discharge Instructions (Signed)
Vicodin for pain if unrelieved with plain Tylenol.  Robaxin for muscle spasms of neck or back

## 2016-12-30 NOTE — ED Triage Notes (Signed)
Pt given 3 nitro with relief of pain as well as 1" of nitropaste applied.

## 2016-12-30 NOTE — ED Notes (Signed)
CBG 371 with [facility].
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
## 2023-11-12 ENCOUNTER — Ambulatory Visit (HOSPITAL_COMMUNITY)
Admission: RE | Admit: 2023-11-12 | Discharge: 2023-11-12 | Disposition: A | Discharge status: Home / Self Care | Source: Ambulatory Visit | Attending: Family Medicine | Admitting: Family Medicine

## 2023-11-12 ENCOUNTER — Other Ambulatory Visit (HOSPITAL_COMMUNITY): Payer: Self-pay | Admitting: Family Medicine

## 2023-11-12 DIAGNOSIS — R059 Cough, unspecified: Secondary | ICD-10-CM
